# Patient Record
Sex: Female | Born: 1990
Health system: Southern US, Community
[De-identification: ages and names within clinical notes are randomized; demographics above are authoritative.]

## PROBLEM LIST (undated history)

## (undated) DIAGNOSIS — N390 Urinary tract infection, site not specified: Secondary | ICD-10-CM

## (undated) DIAGNOSIS — J40 Bronchitis, not specified as acute or chronic: Secondary | ICD-10-CM

## (undated) DIAGNOSIS — B019 Varicella without complication: Secondary | ICD-10-CM

## (undated) DIAGNOSIS — F32A Depression, unspecified: Secondary | ICD-10-CM

## (undated) DIAGNOSIS — F419 Anxiety disorder, unspecified: Secondary | ICD-10-CM

## (undated) DIAGNOSIS — F329 Major depressive disorder, single episode, unspecified: Secondary | ICD-10-CM

## (undated) DIAGNOSIS — E7800 Pure hypercholesterolemia, unspecified: Secondary | ICD-10-CM

## (undated) DIAGNOSIS — K219 Gastro-esophageal reflux disease without esophagitis: Secondary | ICD-10-CM

## (undated) HISTORY — DX: Depression, unspecified: F32.A

## (undated) HISTORY — DX: Varicella without complication: B01.9

## (undated) HISTORY — DX: Major depressive disorder, single episode, unspecified: F32.9

## (undated) HISTORY — DX: Pure hypercholesterolemia, unspecified: E78.00

## (undated) HISTORY — DX: Gastro-esophageal reflux disease without esophagitis: K21.9

## (undated) HISTORY — DX: Urinary tract infection, site not specified: N39.0

## (undated) HISTORY — PX: TONSILLECTOMY: SUR1361

---

## 2017-09-09 ENCOUNTER — Other Ambulatory Visit: Payer: Self-pay

## 2017-09-09 ENCOUNTER — Encounter (HOSPITAL_COMMUNITY): Payer: Self-pay | Admitting: Emergency Medicine

## 2017-09-09 ENCOUNTER — Ambulatory Visit (HOSPITAL_COMMUNITY)
Admission: EM | Admit: 2017-09-09 | Discharge: 2017-09-09 | Disposition: A | Discharge status: Home / Self Care | Payer: BLUE CROSS/BLUE SHIELD | Attending: Internal Medicine | Admitting: Internal Medicine

## 2017-09-09 DIAGNOSIS — F419 Anxiety disorder, unspecified: Secondary | ICD-10-CM | POA: Diagnosis not present

## 2017-09-09 HISTORY — DX: Bronchitis, not specified as acute or chronic: J40

## 2017-09-09 HISTORY — DX: Anxiety disorder, unspecified: F41.9

## 2017-09-09 MED ORDER — ALPRAZOLAM 0.25 MG PO TABS: 0.2500 mg | ORAL_TABLET | Freq: Two times a day (BID) | ORAL | 0 refills | Status: AC | PRN

## 2017-09-09 NOTE — ED Triage Notes (Addendum)
Patient concerned for a panic attack.  Reports over the past 2 days she has had episodes of feeling sob and panicky and feeling like she cannot swallow, and arm numbness, intermittent.  Left upper back with intermittent pain.  Patient has had intermittent chest tightness. Patient is also concerned for dizziness   Cousins husband had a stroke 2 weeks ago from a blood clot and was perceived to be young and healthy and then something so serious happen to them.  Anxiety has been worse for this patient

## 2017-09-09 NOTE — Discharge Instructions (Signed)
Please take Xanax as needed for anxiety. No more than every 12 hours. This will cause drowsiness and sedation, do not drive. If this sedates you too much, only take 1/2 pill.   Please try and get set up with a primary care to further discuss anxiety.

## 2017-09-10 NOTE — ED Provider Notes (Signed)
[facility]    CSN: [id number] Arrival date & time: 09/09/17  1950     History   Chief Complaint Chief Complaint  Patient presents with  . Anxiety    HPI Shacola Schussler Trellis Moment is a 27 y.o. female no significant past medical history presenting today with concern for panic attacks.  She states that over the past 2 days she has had a couple episodes where she feels very short of breath, arm numbness, dizziness, chest tightness, back pain, lightheadedness.  She states these episodes last approximately 20 minutes.  She states that she does suffer from anxiety, but has never been officially diagnosed or taken any medicine for this.  She is concerned today because a relative's husband had a stroke from a blood clot although young and healthy.  She denies any increase in stress recently or anything specific that she worries over.  She states these episodes will hit her randomly.  She denies history of hypertension, diabetes, smoking, family history of death from MI at early age.  Patient is on oral contraceptive pills, and recently traveled to [place] via airplane, denies unilateral lower leg swelling or lower extremity signs of DVT, cough, chest pain, personal history of cancer, previous clot, recent surgery.  She is concerned because she has felt like her left arm has been weak a lot lately.  She denies any acute vision changes.  Denies headache.  Patient does not have PCP hearing [place], originally from [place], working on getting established care in [place].  HPI  Past Medical History:  Diagnosis Date  . Anxiety   . Bronchitis     There are no active problems to display for this patient.   Past Surgical History:  Procedure Laterality Date  . TONSILLECTOMY      OB History    No data available       Home Medications    Prior to Admission medications   Medication Sig Start Date End Date Taking? Authorizing Provider  Amphetamine-Dextroamphetamine (ADDERALL PO)  Take by mouth.   Yes [provider]  NON FORMULARY    Yes [provider]  ALPRAZolam (XANAX) 0.25 MG tablet Take 1 tablet (0.25 mg total) by mouth 2 (two) times daily as needed for anxiety. 09/09/17   Teague Goynes, Junius Creamer, PA-C    Family History Family History  Problem Relation Age of Onset  . Diabetes Father     Social History Social History   Tobacco Use  . Smoking status: Never Smoker  Substance Use Topics  . Alcohol use: Yes  . Drug use: No     Allergies   Patient has no known allergies.   Review of Systems Review of Systems  Constitutional: Negative for chills, fatigue and fever.  HENT: Positive for trouble swallowing. Negative for congestion, ear pain, rhinorrhea and sore throat.   Respiratory: Positive for chest tightness and shortness of breath. Negative for cough.   Cardiovascular: Negative for chest pain, palpitations and leg swelling.  Gastrointestinal: Positive for nausea. Negative for abdominal pain and vomiting.  Musculoskeletal: Positive for back pain. Negative for myalgias.  Skin: Negative for rash.  Neurological: Positive for dizziness, weakness, light-headedness and numbness. Negative for syncope, speech difficulty and headaches.     Physical Exam Triage Vital Signs ED Triage Vitals  Enc Vitals Group     BP 09/09/17 2053 (!) 119/58     Pulse Rate 09/09/17 2053 64     Resp 09/09/17 2053 (!) 22  Temp 09/09/17 2053 98.7 F (37.1 C)     Temp Source 09/09/17 2053 Oral     SpO2 09/09/17 2053 100 %     Weight --      Height --      Head Circumference --      Peak Flow --      Pain Score 09/09/17 2051 4     Pain Loc --      Pain Edu? --      Excl. in GC? --    No data found.  Updated Vital Signs BP (!) 119/58 (BP Location: Left Arm) Comment (BP Location): large cuff  Pulse 64   Temp 98.7 F (37.1 C) (Oral)   Resp (!) 22   LMP 08/26/2017   SpO2 100%   Visual Acuity Right Eye Distance:   Left Eye Distance:   Bilateral  Distance:    Right Eye Near:   Left Eye Near:    Bilateral Near:     Physical Exam  Constitutional: She is oriented to person, place, and time. She appears well-developed and well-nourished. No distress.  HENT:  Head: Normocephalic and atraumatic.  Mouth/Throat: Oropharynx is clear and moist.  Eyes: Conjunctivae and EOM are normal. Pupils are equal, round, and reactive to light.  Neck: Neck supple.  Cardiovascular: Normal rate and regular rhythm.  No murmur heard. Pulmonary/Chest: Effort normal and breath sounds normal. No respiratory distress.  Breathing comfortably at rest during time of visit, clear to auscultation bilaterally; chest nontender to palpation  Abdominal: Soft. There is no tenderness.  Nontender to light and deep palpation  Musculoskeletal: She exhibits no edema.  No signs of DVT, bilateral lower extremities without calf tenderness, erythema, swelling.  Neurological: She is alert and oriented to person, place, and time.  Cranial nerves II through XII grossly intact, biceps tendon reflex 2+ bilaterally, patellar reflexes not obtainable bilaterally, patient states this is normal, coordination normal-finger to nose, rapid alternating movements, heel to shin normal.  Strength 5/5 equal in bilateral shoulders, hips, knees in all directions.  Sensation intact distally at upper extremities.  Skin: Skin is warm and dry.  Psychiatric: She has a normal mood and affect.  Nursing note and vitals reviewed.    UC Treatments / Results  Labs (all labs ordered are listed, but only abnormal results are displayed) Labs Reviewed - No data to display  EKG  EKG Interpretation None       Radiology No results found.  Procedures Procedures (including critical care time)  Medications Ordered in UC Medications - No data to display   Initial Impression / Assessment and Plan / UC Course  I have reviewed the triage vital signs and the nursing notes.  Pertinent labs & imaging  results that were available during my care of the patient were reviewed by me and considered in my medical decision making (see chart for details).     Patient with intermittent symptoms of chest discomfort, left arm numbness, shortness of breath.  EKG today normal sinus, no signs of ischemia.  Neuro exam with no focal neuro deficits.  Patient has negative risk factors for coronary artery disease, less concerning for acute ischemic event.  While patient does have positive risk factors of OCPs, recent airplane travel for PE, feel this diagnosis is less likely.  Patient had similar symptoms prior to beginning oral contraceptives 2 weeks ago as well as flying approximately 2 weeks ago as well.  Patient also without tachycardia, oxygen 98%.  Advised patient  to continue to monitor symptoms, and if symptoms persist or progress she may warrant further evaluation.  Discussed warning signs for this.  Advised patient to establish care with PCP to further discuss anxiety and to discuss need/want for any daily medicines.  In the meantime advised patient to take deep breaths during these episodes, will provide a few Xanax to take.  Patient plans to take half of a 0.25 to start.  She understands this is only as needed for these attacks.  Advised that will cause drowsiness, do not drive after using.  Panic attacks becoming more frequent or symptoms worsening, return here or go to emergency room.Discussed strict return precautions. Patient verbalized understanding and is agreeable with plan.   Final Clinical Impressions(s) / UC Diagnoses   Final diagnoses:  Anxiety    ED Discharge Orders        Ordered    ALPRAZolam (XANAX) 0.25 MG tablet  2 times daily PRN     09/09/17 2148       Controlled Substance Prescriptions [place] Controlled Substance Registry consulted? No   Lew Dawes, [place] 09/10/17 0041

## 2017-09-27 ENCOUNTER — Ambulatory Visit: Payer: BLUE CROSS/BLUE SHIELD | Admitting: Family Medicine

## 2017-09-27 ENCOUNTER — Encounter: Payer: Self-pay | Admitting: Family Medicine

## 2017-09-27 VITALS — BP 110/78 | HR 69 | Temp 98.0°F | Ht 61.0 in | Wt 241.0 lb

## 2017-09-27 DIAGNOSIS — Z13220 Encounter for screening for lipoid disorders: Secondary | ICD-10-CM

## 2017-09-27 DIAGNOSIS — J40 Bronchitis, not specified as acute or chronic: Secondary | ICD-10-CM

## 2017-09-27 DIAGNOSIS — F419 Anxiety disorder, unspecified: Secondary | ICD-10-CM

## 2017-09-27 DIAGNOSIS — Z131 Encounter for screening for diabetes mellitus: Secondary | ICD-10-CM | POA: Diagnosis not present

## 2017-09-27 DIAGNOSIS — F900 Attention-deficit hyperactivity disorder, predominantly inattentive type: Secondary | ICD-10-CM | POA: Diagnosis not present

## 2017-09-27 DIAGNOSIS — Z0000 Encounter for general adult medical examination without abnormal findings: Secondary | ICD-10-CM | POA: Diagnosis not present

## 2017-09-27 LAB — BASIC METABOLIC PANEL
BUN: 11 mg/dL (ref 6–23)
CALCIUM: 9.3 mg/dL (ref 8.4–10.5)
CHLORIDE: 103 meq/L (ref 96–112)
CO2: 26 meq/L (ref 19–32)
CREATININE: 0.72 mg/dL (ref 0.40–1.20)
GFR: 104.51 mL/min (ref >60.00)
Glucose, Bld: 84 mg/dL (ref 70–99)
Potassium: 4.6 mEq/L (ref 3.5–5.1)
Sodium: 137 mEq/L (ref 135–145)

## 2017-09-27 LAB — LIPID PANEL
CHOLESTEROL: 176 mg/dL (ref 0–200)
HDL: 48.5 mg/dL (ref >39.00)
LDL CALC: 102 mg/dL — AB (ref 0–99)
NonHDL: 127.07
TRIGLYCERIDES: 125 mg/dL (ref 0.0–149.0)
Total CHOL/HDL Ratio: 4
VLDL: 25 mg/dL (ref 0.0–40.0)

## 2017-09-27 LAB — CBC
HEMATOCRIT: 37.4 % (ref 36.0–46.0)
Hemoglobin: 12.1 g/dL (ref 12.0–15.0)
MCHC: 32.4 g/dL (ref 30.0–36.0)
MCV: 81.1 fl (ref 78.0–100.0)
Platelets: 245 10*3/uL (ref 150.0–400.0)
RBC: 4.62 Mil/uL (ref 3.87–5.11)
RDW: 15.2 % (ref 11.5–15.5)
WBC: 7.2 10*3/uL (ref 4.0–10.5)

## 2017-09-27 LAB — HEMOGLOBIN A1C: HEMOGLOBIN A1C: 5.5 % (ref 4.6–6.5)

## 2017-09-27 MED ORDER — PREDNISONE 10 MG PO TABS: ORAL_TABLET | ORAL | 0 refills | Status: DC

## 2017-09-27 NOTE — Patient Instructions (Addendum)
Preventive Care 18-39 Years, Female Preventive care refers to lifestyle choices and visits with your health care provider that can promote health and wellness. What does preventive care include?  A yearly physical exam. This is also called an annual well check.  Dental exams once or twice a year.  Routine eye exams. Ask your health care provider how often you should have your eyes checked.  Personal lifestyle choices, including: ? Daily care of your teeth and gums. ? Regular physical activity. ? Eating a healthy diet. ? Avoiding tobacco and drug use. ? Limiting alcohol use. ? Practicing safe sex. ? Taking vitamin and mineral supplements as recommended by your health care provider. What happens during an annual well check? The services and screenings done by your health care provider during your annual well check will depend on your age, overall health, lifestyle risk factors, and family history of disease. Counseling Your health care provider may ask you questions about your:  Alcohol use.  Tobacco use.  Drug use.  Emotional well-being.  Home and relationship well-being.  Sexual activity.  Eating habits.  Work and work Statistician.  Method of birth control.  Menstrual cycle.  Pregnancy history.  Screening You may have the following tests or measurements:  Height, weight, and BMI.  Diabetes screening. This is done by checking your blood sugar (glucose) after you have not eaten for a while (fasting).  Blood pressure.  Lipid and cholesterol levels. These may be checked every 5 years starting at age 66.  Skin check.  Hepatitis C blood test.  Hepatitis B blood test.  Sexually transmitted disease (STD) testing.  BRCA-related cancer screening. This may be done if you have a family history of breast, ovarian, tubal, or peritoneal cancers.  Pelvic exam and Pap test. This may be done every 3 years starting at age 40. Starting at age 59, this may be done every 5  years if you have a Pap test in combination with an HPV test.  Discuss your test results, treatment options, and if necessary, the need for more tests with your health care provider. Vaccines Your health care provider may recommend certain vaccines, such as:  Influenza vaccine. This is recommended every year.  Tetanus, diphtheria, and acellular pertussis (Tdap, Td) vaccine. You may need a Td booster every 10 years.  Varicella vaccine. You may need this if you have not been vaccinated.  HPV vaccine. If you are 69 or younger, you may need three doses over 6 months.  Measles, mumps, and rubella (MMR) vaccine. You may need at least one dose of MMR. You may also need a second dose.  Pneumococcal 13-valent conjugate (PCV13) vaccine. You may need this if you have certain conditions and were not previously vaccinated.  Pneumococcal polysaccharide (PPSV23) vaccine. You may need one or two doses if you smoke cigarettes or if you have certain conditions.  Meningococcal vaccine. One dose is recommended if you are age 27-21 years and a first-year college student living in a residence hall, or if you have one of several medical conditions. You may also need additional booster doses.  Hepatitis A vaccine. You may need this if you have certain conditions or if you travel or work in places where you may be exposed to hepatitis A.  Hepatitis B vaccine. You may need this if you have certain conditions or if you travel or work in places where you may be exposed to hepatitis B.  Haemophilus influenzae type b (Hib) vaccine. You may need this if  you have certain risk factors.  Talk to your health care provider about which screenings and vaccines you need and how often you need them. This information is not intended to replace advice given to you by your health care provider. Make sure you discuss any questions you have with your health care provider. Document Released: 09/08/2001 Document Revised: 04/01/2016  Document Reviewed: 05/14/2015 Elsevier Interactive Patient Education  2018 [place] After being diagnosed with an anxiety disorder, you may be relieved to know why you have felt or behaved a certain way. It is natural to also feel overwhelmed about the treatment ahead and what it will mean for your life. With care and support, you can manage this condition and recover from it. How to cope with anxiety Dealing with stress Stress is your body's reaction to life changes and events, both good and bad. Stress can last just a few hours or it can be ongoing. Stress can play a major role in anxiety, so it is important to learn both how to cope with stress and how to think about it differently. Talk with your health care provider or a counselor to learn more about stress reduction. He or she may suggest some stress reduction techniques, such as:  Music therapy. This can include creating or listening to music that you enjoy and that inspires you.  Mindfulness-based meditation. This involves being aware of your normal breaths, rather than trying to control your breathing. It can be done while sitting or walking.  Centering prayer. This is a kind of meditation that involves focusing on a word, phrase, or sacred image that is meaningful to you and that brings you peace.  Deep breathing. To do this, expand your stomach and inhale slowly through your nose. Hold your breath for 3-5 seconds. Then exhale slowly, allowing your stomach muscles to relax.  Self-talk. This is a skill where you identify thought patterns that lead to anxiety reactions and correct those thoughts.  Muscle relaxation. This involves tensing muscles then relaxing them.  Choose a stress reduction technique that fits your lifestyle and personality. Stress reduction techniques take time and practice. Set aside 5-15 minutes a day to do them. Therapists can offer training in these techniques. The training may be  covered by some insurance plans. Other things you can do to manage stress include:  Keeping a stress diary. This can help you learn what triggers your stress and ways to control your response.  Thinking about how you respond to certain situations. You may not be able to control everything, but you can control your reaction.  Making time for activities that help you relax, and not feeling guilty about spending your time in this way.  Therapy combined with coping and stress-reduction skills provides the best chance for successful treatment. Medicines Medicines can help ease symptoms. Medicines for anxiety include:  Anti-anxiety drugs.  Antidepressants.  Beta-blockers.  Medicines may be used as the main treatment for anxiety disorder, along with therapy, or if other treatments are not working. Medicines should be prescribed by a health care provider. Relationships Relationships can play a big part in helping you recover. Try to spend more time connecting with trusted friends and family members. Consider going to couples counseling, taking family education classes, or going to family therapy. Therapy can help you and others better understand the condition. How to recognize changes in your condition Everyone has a different response to treatment for anxiety. Recovery from anxiety happens when symptoms decrease  and stop interfering with your daily activities at home or work. This may mean that you will start to:  Have better concentration and focus.  Sleep better.  Be less irritable.  Have more energy.  Have improved memory.  It is important to recognize when your condition is getting worse. Contact your health care provider if your symptoms interfere with home or work and you do not feel like your condition is improving. Where to find help and support: You can get help and support from these sources:  Self-help groups.  Online and [employer].  A trusted spiritual  leader.  Couples counseling.  Family education classes.  Family therapy.  Follow these instructions at home:  Eat a healthy diet that includes plenty of vegetables, fruits, whole grains, low-fat dairy products, and lean protein. Do not eat a lot of foods that are high in solid fats, added sugars, or salt.  Exercise. Most adults should do the following: ? Exercise for at least 150 minutes each week. The exercise should increase your heart rate and make you sweat (moderate-intensity exercise). ? Strengthening exercises at least twice a week.  Cut down on caffeine, tobacco, alcohol, and other potentially harmful substances.  Get the right amount and quality of sleep. Most adults need 7-9 hours of sleep each night.  Make choices that simplify your life.  Take over-the-counter and prescription medicines only as told by your health care provider.  Avoid caffeine, alcohol, and certain over-the-counter cold medicines. These may make you feel worse. Ask your pharmacist which medicines to avoid.  Keep all follow-up visits as told by your health care provider. This is important. Questions to ask your health care provider  Would I benefit from therapy?  How often should I follow up with a health care provider?  How long do I need to take medicine?  Are there any long-term side effects of my medicine?  Are there any alternatives to taking medicine? Contact a health care provider if:  You have a hard time staying focused or finishing daily tasks.  You spend many hours a day feeling worried about everyday life.  You become exhausted by worry.  You start to have headaches, feel tense, or have nausea.  You urinate more than normal.  You have diarrhea. Get help right away if:  You have a racing heart and shortness of breath.  You have thoughts of hurting yourself or others. If you ever feel like you may hurt yourself or others, or have thoughts about taking your own life, get  help right away. You can go to your nearest emergency department or call:  Your local emergency services (911 in the [place]  A suicide crisis helpline, such as the [place] at (774) 874-8775. This is open 24-hours a day.  Summary  Taking steps to deal with stress can help calm you.  Medicines cannot cure anxiety disorders, but they can help ease symptoms.  Family, friends, and partners can play a big part in helping you recover from an anxiety disorder. This information is not intended to replace advice given to you by your health care provider. Make sure you discuss any questions you have with your health care provider. Document Released: 07/07/2016 Document Revised: 07/07/2016 Document Reviewed: 07/07/2016 Elsevier Interactive Patient Education  2018 [place] With Attention Deficit Hyperactivity Disorder If you have been diagnosed with attention deficit hyperactivity disorder (ADHD), you may be relieved that you now know why you have felt or behaved a  certain way. Still, you may feel overwhelmed about the treatment ahead. You may also wonder how to get the support you need and how to deal with the condition day-to-day. With treatment and support, you can live with ADHD and manage your symptoms. How to manage lifestyle changes Managing stress Stress is your body's reaction to life changes and events, both good and bad. To cope with the stress of an ADHD diagnosis, it may help to:  Learn more about ADHD.  Exercise regularly. Even a short daily walk can lower stress levels.  Participate in training or education programs (including social skills training classes) that teach you to deal with symptoms.  Medicines Your health care provider may suggest certain medicines if he or she feels that they will help to improve your condition. Stimulant medicines are usually prescribed to treat ADHD, and therapy may also be prescribed. It is important  to:  Avoid using alcohol and other substances that may prevent your medicines from working properly [facility]).  Talk with your pharmacist or health care provider about all the medicines that you take, their possible side effects, and what medicines are safe to take together.  Make it your goal to take part in all treatment decisions (shared decision-making). Ask about possible side effects of medicines that your health care provider recommends, and tell him or her how you feel about having those side effects. It is best if shared decision-making with your health care provider is part of your total treatment plan.  Relationships To strengthen your relationships with family members while treating your condition, consider taking part in family therapy. You might also attend self-help groups alone or with a loved one. Be honest about how your symptoms affect your relationships. Make an effort to communicate respectfully instead of fighting, and find ways to show others that you care. Psychotherapy may be useful in helping you cope with how ADHD affects your relationships. How to recognize changes in your condition The following signs may mean that your treatment is working well and your condition is improving:  Consistently being on time for appointments.  Being more organized at home and work.  Other people noticing improvements in your behavior.  Achieving goals that you set for yourself.  Thinking more clearly.  The following signs may mean that your treatment is not working very well:  Feeling impatience or more confusion.  Missing, forgetting, or being late for appointments.  An increasing sense of disorganization and messiness.  More difficulty in reaching goals that you set for yourself.  Loved ones becoming angry or frustrated with you.  Where to find support Talking to others  Keep emotion out of important discussions and speak in a calm, logical way.  Listen  closely and patiently to your loved ones. Try to understand their point of view, and try to avoid getting defensive.  Take responsibility for the consequences of your actions.  Ask that others do not take your behaviors personally.  Aim to solve problems as they come up, and express your feelings instead of bottling them up.  Talk openly about what you need from your loved ones and how they can support you.  Consider going to family therapy sessions or having your family meet with a specialist who deals with ADHD-related behavior problems. Finances Not all insurance plans cover mental health care, so it is important to check with your insurance carrier. If paying for co-pays or counseling services is a problem, search for a local or county mental health  care center. Public mental health care services may be offered there at a low cost or no cost when you are not able to see a private health care provider. If you are taking medicine for ADHD, you may be able to get the generic form, which may be less expensive than brand-name medicine. Some makers of prescription medicines also offer help to patients who cannot afford the medicines that they need. Follow these instructions at home:  Take over-the-counter and prescription medicines only as told by your health care provider. Check with your health care provider before taking any new medicines.  Create structure and an organized atmosphere at home. For example: ? Make a list of tasks, then rank them from most important to least important. Work on one task at a time until your listed tasks are done. ? Make a daily schedule and follow it consistently every day. ? Use an appointment calendar, and check it 2 or 3 times a day to keep on track. Keep it with you when you leave the house. ? Create spaces where you keep certain things, and always put things back in their places after you use them.  Keep all follow-up visits as told by your health care  provider. This is important. Questions to ask your health care provider:  What are the risks and benefits of taking medicines?  Would I benefit from therapy?  How often should I follow up with a health care provider? Contact a health care provider if:  You have side effects from your medicines, such as: ? Repeated muscle twitches, coughing, or speech outbursts. ? Sleep problems. ? Loss of appetite. ? Depression. ? New or worsening behavior problems. ? Dizziness. ? Unusually fast heartbeat. ? Stomach pains. ? Headaches. Get help right away if:  You have a severe reaction to a medicine.  Your behavior suddenly gets worse. Summary  With treatment and support, you can live with ADHD and manage your symptoms.  The medicines that are most often prescribed for ADHD are stimulants.  Consider taking part in family therapy or self-help groups with family members or friends.  When you talk with friends and family about your ADHD, be patient and communicate openly.  Take over-the-counter and prescription medicines only as told by your health care provider. Check with your health care provider before taking any new medicines. This information is not intended to replace advice given to you by your health care provider. Make sure you discuss any questions you have with your health care provider. Document Released: 11/12/2016 Document Revised: 11/12/2016 Document Reviewed: 11/12/2016 Elsevier Interactive Patient Education  [employer].

## 2017-09-27 NOTE — Progress Notes (Signed)
Patient presents to clinic today for CPE and to establish care.  SUBJECTIVE: PMH:  Pt is a 27 yo with pmh sig for ADHD, Anxiety.  She was previously seen in [place].  Pt is also followed by OB/GYN.  Pt is on OCPs.  H/o Bronchitis: -Patient endorses recent episode of bronchitis d/x'd in late December/January. -Patient endorses feeling better but the cough is still lingering. -Patient denies fever, chills, headache, shortness of breath  ADHD: -d/x'd 2 years ago -Patient is followed by a psychiatrist -She is currently on Adderall 20 mg twice daily -Patient denies issues with sleep, weight loss, history of blood pressure issues  Anxiety/depression: -Patient is followed by psychiatry -Patient endorses recent panic attack for which she went to [facility] -At that time she was given Rx for Xanax 0.25 mg -pt states a family member who was in good health recently had a stroke in his early 35s.  The thought of this made her anxious. -Patient states her mood, energy, sleep are good.  Allergies: NKDA  Past surgical history: Tonsillectomy  Social history: Patient is single.  She denies alcohol, tobacco, drug use.  LMP 08/20/17  Family medical history: Mom-alive Dad-alive, diabetes Brother-Robert, alive  Health Maintenance: PAP -- 08/23/17   Past Medical History:  Diagnosis Date  . Anxiety   . Bronchitis   . Chicken pox   . Depression   . GERD (gastroesophageal reflux disease)   . High cholesterol   . UTI (urinary tract infection)     Past Surgical History:  Procedure Laterality Date  . TONSILLECTOMY      Current Outpatient Medications on File Prior to Visit  Medication Sig Dispense Refill  . ALPRAZolam (XANAX) 0.25 MG tablet Take 1 tablet (0.25 mg total) by mouth 2 (two) times daily as needed for anxiety. 10 tablet 0  . Amphetamine-Dextroamphetamine (ADDERALL PO) Take by mouth.    Marland Kitchen BLISOVI 24 FE 1-20 MG-MCG(24) tablet Take 1 tablet by mouth daily.  2  . NON FORMULARY        No current facility-administered medications on file prior to visit.     No Known Allergies  Family History  Problem Relation Age of Onset  . Diabetes Father     Social History   Socioeconomic History  . Marital status: Single    Spouse name: Not on file  . Number of children: Not on file  . Years of education: Not on file  . Highest education level: Not on file  Social Needs  . Financial resource strain: Not on file  . Food insecurity - worry: Not on file  . Food insecurity - inability: Not on file  . Transportation needs - medical: Not on file  . Transportation needs - non-medical: Not on file  Occupational History  . Not on file  Tobacco Use  . Smoking status: Never Smoker  . Smokeless tobacco: Never Used  Substance and Sexual Activity  . Alcohol use: Yes    Comment: ocassionally   . Drug use: No  . Sexual activity: Yes  Other Topics Concern  . Not on file  Social History Narrative  . Not on file    ROS General: Denies fever, chills, night sweats, changes in weight, changes in appetite HEENT: Denies headaches, ear pain, changes in vision, rhinorrhea, sore throat CV: Denies CP, palpitations, SOB, orthopnea Pulm: Denies SOB, cough, wheezing GI: Denies abdominal pain, nausea, vomiting, diarrhea, constipation GU: Denies dysuria, hematuria, frequency, vaginal discharge Msk: Denies muscle cramps, joint pains  Neuro: Denies weakness, numbness, tingling Skin: Denies rashes, bruising Psych: Denies hallucinations  +depression, anxiety  BP 110/78   Pulse 69   Temp 98 F (36.7 C) (Oral)   Ht 5\' 1"  (1.549 m)   Wt 241 lb (109.3 kg)   LMP 09/20/2017 (Approximate)   SpO2 100%   BMI 45.54 kg/m   Physical Exam Gen. Pleasant, well developed, well-nourished, in NAD HEENT - [place]/AT, PERRL, no scleral icterus, no nasal drainage, pharynx without erythema or exudate. Neck: No JVD, no thyromegaly Lungs: occasional cough, no use of accessory muscles, faint scattered  wheezes b/l. Cardiovascular: RRR, No r/g/m, no peripheral edema Abdomen: BS present, soft, nontender,nondistended, no hepatosplenomegaly Musculoskeletal: No deformities, moves all four extremities, no cyanosis or clubbing, normal tone Neuro:  A&Ox3, CN II-XII intact, normal gait Skin:  Warm, dry, intact, no lesions Psych: normal affect, mood appropriate  No results found for this or any previous visit (from the past 2160 hour(s)).  Assessment/Plan: Well adult exam -Anticipatory guidance given including wearing seatbelts, smoke detectors in the home, increasing physical activity, increasing p.o. intake of water, increasing p.o. intake of vegetables. -Patient given handout -Pap up-to-date as followed by OB/GYN -We will obtain labs this visit -Next CPE in 1 year - Plan: Basic metabolic panel, CBC (no diff)  Attention deficit hyperactivity disorder (ADHD), predominantly inattentive type -Continue Adderall 20 mg twice daily -Continue following with psychiatry  Anxiety -Stable -Patient encouraged to continue following with psychiatry -Patient given handout -Patient has Rx for Xanax 0.25 mg, prescribed by UC  Screening for cholesterol level - Plan: Lipid panel  Screening for diabetes mellitus - Plan: Hemoglobin A1c  Bronchitis - Plan: predniSONE (DELTASONE) 10 MG tablet  Follow-up PRN.  Next CPE in 1 year  Abbe AmsterdamShannon Felicha Frayne, MD

## 2018-03-24 ENCOUNTER — Ambulatory Visit: Payer: BLUE CROSS/BLUE SHIELD | Admitting: Family Medicine

## 2018-03-24 ENCOUNTER — Encounter: Payer: Self-pay | Admitting: Family Medicine

## 2018-03-24 ENCOUNTER — Ambulatory Visit (INDEPENDENT_AMBULATORY_CARE_PROVIDER_SITE_OTHER): Payer: BLUE CROSS/BLUE SHIELD

## 2018-03-24 VITALS — BP 106/68 | HR 86 | Temp 97.5°F | Wt 247.0 lb

## 2018-03-24 DIAGNOSIS — R05 Cough: Secondary | ICD-10-CM | POA: Diagnosis not present

## 2018-03-24 DIAGNOSIS — J302 Other seasonal allergic rhinitis: Secondary | ICD-10-CM | POA: Diagnosis not present

## 2018-03-24 DIAGNOSIS — R059 Cough, unspecified: Secondary | ICD-10-CM

## 2018-03-24 MED ORDER — LORATADINE 10 MG PO TABS: 10.0000 mg | ORAL_TABLET | Freq: Every day | ORAL | 11 refills | Status: AC

## 2018-03-24 NOTE — Progress Notes (Signed)
Subjective:    Patient ID: Bailey Oneill, female    DOB: 01/30/1991, 27 y.o.   MRN: [id number]  No chief comDonato Schultzplaint on file.   HPI Patient was seen today for ongoing concern.  Pt notes continued cough, wheezing, and occasional nasal drainage since Dec 2018/Jan 2019.  Pt has been dx'd with bronchitis numerous times.  [facility]  Pt denies h/o allergies, but does note as a child she had to take an allergy med.  Pt also mentions having "cold symptoms" if she is around her family dog for too long.  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  [place].  Past Medical History:  Diagnosis Date  . Anxiety   . Bronchitis   . Chicken pox   . Depression   . GERD (gastroesophageal reflux disease)   . High cholesterol   . UTI (urinary tract infection)     No Known Allergies  ROS General: Denies fever, chills, night sweats, changes in weight, changes in appetite HEENT: Denies headaches, ear pain, changes in vision, rhinorrhea, sore throat  +occasional nasal drainage CV: Denies CP, palpitations, SOB, orthopnea Pulm: Denies SOB    +chronic cough, wheezing GI: Denies abdominal pain, nausea, vomiting, diarrhea, constipation GU: Denies dysuria, hematuria, frequency, vaginal discharge Msk: Denies muscle cramps, joint pains Neuro: Denies weakness, numbness, tingling Skin: Denies rashes, bruising Psych: Denies depression, anxiety, hallucinations     Objective:    Blood pressure 106/68, pulse 86, temperature (!) 97.5 F (36.4 C), temperature source Oral, weight 247 lb (112 kg), SpO2 98 %.   Gen. Pleasant, well-nourished, in no distress, normal affect   HEENT: English/AT, face symmetric, no scleral icterus, PERRLA, EOMI, nares patent without drainage, Mallampati 3 difficult to assess pharynx.  TMs full bilaterally.  No cervical lymphadenopathy. Lungs: cough, no accessory muscle use, CTAB, no wheezes or  rales Cardiovascular: RRR, no m/r/g, no peripheral edema Neuro:  A&Ox3, CN II-XII intact, normal gait Skin:  Warm, no lesions/ rash   Wt Readings from Last 3 Encounters:  03/24/18 247 lb (112 kg)  09/27/17 241 lb (109.3 kg)    Lab Results  Component Value Date   WBC 7.2 09/27/2017   HGB 12.1 09/27/2017   HCT 37.4 09/27/2017   PLT 245.0 09/27/2017   GLUCOSE 84 09/27/2017   CHOL 176 09/27/2017   TRIG 125.0 09/27/2017   HDL 48.50 09/27/2017   LDLCALC 102 (H) 09/27/2017   NA 137 09/27/2017   K 4.6 09/27/2017   CL 103 09/27/2017   CREATININE 0.72 09/27/2017   BUN 11 09/27/2017   CO2 26 09/27/2017   HGBA1C 5.5 09/27/2017    Assessment/Plan:  Cough  -discussed possible causes including allergies, infection, etc. -will obtain CXR given duration of symptoms -given handout - Plan: DG Chest 2 View, loratadine (CLARITIN) 10 MG tablet  Seasonal allergies  - Plan: loratadine (CLARITIN) 10 MG tablet  F/u prn  Abbe AmsterdamShannon Banks, MD

## 2018-03-24 NOTE — Patient Instructions (Signed)
Allergies, Adult An allergy is when your body's defense system (immune system) overreacts to an otherwise harmless substance (allergen) that you breathe in or eat or something that touches your skin. When you come into contact with something that you are allergic to, your immune system produces certain proteins (antibodies). These proteins cause cells to release chemicals (histamines) that trigger the symptoms of an allergic reaction. Allergies often affect the nasal passages (allergic rhinitis), eyes (allergic conjunctivitis), skin (atopic dermatitis), and stomach. Allergies can be mild or severe. Allergies cannot spread from person to person (are not contagious). They can develop at any age and may be outgrown. What increases the risk? You may be at greater risk of allergies if other people in your family have allergies. What are the signs or symptoms? Symptoms depend on what type of allergy you have. They may include:  Runny, stuffy nose.  Sneezing.  Itchy mouth, ears, or throat.  Postnasal drip.  Sore throat.  Itchy, red, watery, or puffy eyes.  Skin rash or hives.  Stomach pain.  Vomiting.  Diarrhea.  Bloating.  Wheezing or coughing.  People with a severe allergy to food, medicine, or an insect bite may have a life-threatening allergic reaction (anaphylaxis). Symptoms of anaphylaxis include:  Hives.  Itching.  Flushed face.  Swollen lips, tongue, or mouth.  Tight or swollen throat.  Chest pain or tightness in the chest.  Trouble breathing or shortness of breath.  Rapid heartbeat.  Dizziness or fainting.  Vomiting.  Diarrhea.  Pain in the abdomen.  How is this diagnosed? This condition is diagnosed based on:  Your symptoms.  Your family and medical history.  A physical exam.  You may need to see a health care provider who specializes in treating allergies (allergist). You may also have tests, including:  Skin tests to see which allergens are  causing your symptoms, such as: ? Skin prick test. In this test, your skin is pricked with a tiny needle and exposed to small amounts of possible allergens to see if your skin reacts. ? Intradermal skin test. In this test, a small amount of allergen is injected under your skin to see if your skin reacts. ? Patch test. In this test, a small amount of allergen is placed on your skin and then your skin is covered with a bandage. Your health care provider will check your skin after a couple of days to see if a rash has developed.  Blood tests.  Challenges tests. In this test, you inhale a small amount of allergen by mouth to see if you have an allergic reaction.  You may also be asked to:  Keep a food diary. A food diary is a record of all the foods and drinks you have in a day and any symptoms you experience.  Practice an elimination diet. An elimination diet involves eliminating specific foods from your diet and then adding them back in one by one to find out if a certain food causes an allergic reaction.  How is this treated? Treatment for allergies depends on your symptoms. Treatment may include:  Cold compresses to soothe itching and swelling.  Eye drops.  Nasal sprays.  Using a saline spray or container (neti pot) to flush out the nose (nasal irrigation). These methods can help clear away mucus and keep the nasal passages moist.  Using a humidifier.  Oral antihistamines or other medicines to block allergic reaction and inflammation.  Skin creams to treat rashes or itching.  Diet changes to   eliminate food allergy triggers.  Repeated exposure to tiny amounts of allergens to build up a tolerance and prevent future allergic reactions (immunotherapy). These include: ? Allergy shots. ? Oral treatment. This involves taking small doses of an allergen under the tongue (sublingual immunotherapy).  Emergency epinephrine injection (auto-injector) in case of an allergic emergency. This is  a self-injectable, pre-measured medicine that must be given within the first few minutes of a serious allergic reaction.  Follow these instructions at home:  Avoid known allergens whenever possible.  If you suffer from airborne allergens, wash out your nose daily. You can do this with a saline spray or a neti pot to flush out your nose (nasal irrigation).  Take over-the-counter and prescription medicines only as told by your health care provider.  Keep all follow-up visits as told by your health care provider. This is important.  If you are at risk of a severe allergic reaction (anaphylaxis), keep your auto-injector with you at all times.  If you have ever had anaphylaxis, wear a medical alert bracelet or necklace that states you have a severe allergy. Contact a health care provider if:  Your symptoms do not improve with treatment. Get help right away if:  You have symptoms of anaphylaxis, such as: ? Swollen mouth, tongue, or throat. ? Pain or tightness in your chest. ? Trouble breathing or shortness of breath. ? Dizziness or fainting. ? Severe abdominal pain, vomiting, or diarrhea. This information is not intended to replace advice given to you by your health care provider. Make sure you discuss any questions you have with your health care provider. Document Released: 10/06/2002 Document Revised: 11/11/2016 Document Reviewed: 01/29/2016 Elsevier Interactive Patient Education  2018 [employer]  Cough, Adult Coughing is a reflex that clears your throat and your airways. Coughing helps to heal and protect your lungs. It is normal to cough occasionally, but a cough that happens with other symptoms or lasts a long time may be a sign of a condition that needs treatment. A cough may last only 2-3 weeks (acute), or it may last longer than 8 weeks (chronic). What are the causes? Coughing is commonly caused by:  Breathing in substances that irritate your lungs.  A viral or bacterial  respiratory infection.  Allergies.  Asthma.  Postnasal drip.  Smoking.  Acid backing up from the stomach into the esophagus (gastroesophageal reflux).  Certain medicines.  Chronic lung problems, including COPD (or rarely, lung cancer).  Other medical conditions such as heart failure.  Follow these instructions at home: Pay attention to any changes in your symptoms. Take these actions to help with your discomfort:  Take medicines only as told by your health care provider. ? If you were prescribed an antibiotic medicine, take it as told by your health care provider. Do not stop taking the antibiotic even if you start to feel better. ? Talk with your health care provider before you take a cough suppressant medicine.  Drink enough fluid to keep your urine clear or pale yellow.  If the air is dry, use a cold steam vaporizer or humidifier in your bedroom or your home to help loosen secretions.  Avoid anything that causes you to cough at work or at home.  If your cough is worse at night, try sleeping in a semi-upright position.  Avoid cigarette smoke. If you smoke, quit smoking. If you need help quitting, ask your health care provider.  Avoid caffeine.  Avoid alcohol.  Rest as needed.  Contact a health  care provider if:  You have new symptoms.  You cough up pus.  Your cough does not get better after 2-3 weeks, or your cough gets worse.  You cannot control your cough with suppressant medicines and you are losing sleep.  You develop pain that is getting worse or pain that is not controlled with pain medicines.  You have a fever.  You have unexplained weight loss.  You have night sweats. Get help right away if:  You cough up blood.  You have difficulty breathing.  Your heartbeat is very fast. This information is not intended to replace advice given to you by your health care provider. Make sure you discuss any questions you have with your health care  provider. Document Released: 01/09/2011 Document Revised: 12/19/2015 Document Reviewed: 09/19/2014 Elsevier Interactive Patient Education  [employer].

## 2018-03-29 ENCOUNTER — Telehealth: Payer: Self-pay | Admitting: Family Medicine

## 2018-03-29 NOTE — Telephone Encounter (Signed)
Copied from CRM [id number]. Topic: Quick Communication - Lab Results >> Mar 29, 2018  9:02 AM Carola Rhine, CMA wrote: Called patient to inform them of  lab results. When patient returns call, triage nurse may disclose results.   Patient was already given the results but has additional questions for the nurse

## 2018-03-29 NOTE — Telephone Encounter (Signed)
Pt calling after receiving results from chest xray on 9/39/19. Pt states she is aware that she has chronic bronchitis and looked up causes online in which she noted that it was most commonly diagnosed in smokers. Pt states she is not a smoker and wanted to know what else could cause bronchitis. Explained to pt that due to the length of time she experienced symptoms that she was diagnosed with chronic bronchitis and also that it was not limited to being a smoker. Explained to pt that bronchitis could be caused by particulates getting into the lungs and causing irritation.Pt also asking if Delsym would help treat bronchitis. Explained to pt that Delsym was used to help with the cough that is associated with bronchitis. Pt advised that if cough did not improve with taking Delsym to return call to the office. Pt verbalized understanding.

## 2018-03-29 NOTE — Telephone Encounter (Signed)
Patient called, left VM to return call to the office for additional questions about imaging results given on 03/29/18-see result note.

## 2018-04-01 ENCOUNTER — Ambulatory Visit: Payer: Self-pay | Admitting: Family Medicine

## 2018-04-01 NOTE — Telephone Encounter (Signed)
   Reason for Disposition . Caller has medication question only, adult not sick, and triager answers question    Wanted to know if it was ok to take Benadryl  Answer Assessment - Initial Assessment Questions 1. SYMPTOMS: "Do you have any symptoms?"     She was wanting to know if it was ok to take Benadryl.   She has been diagnosed with bronchitis by a doctor. I reviewed her medications and noticed she has Claritin prescribed.   She denies taking it but asked if it was ok to take it instead of Benadryl.   I let her know it would be ok.    She can take Benadryl if she wants but don't take both of them    Either take the Claritin which is a 24 hour pill so don't cut it in half or crush it.   Take it whole.   That way she gets 24 hour relief.    If she decided to take the Benadryl don't take the Claritin.   She verbalized understanding and thanked me for my help. 2. SEVERITY: If symptoms are present, ask "Are they mild, moderate or severe?"     I returned her call and answered her question.  Protocols used: MEDICATION QUESTION CALL-A-AH

## 2019-03-09 IMAGING — DX DG CHEST 2V
2 series · 2 of 2 positions shown · non-contrast
Comparison: None.

CLINICAL DATA: Continued cough for several months

EXAM:
CHEST - 2 VIEW

[chest pa]
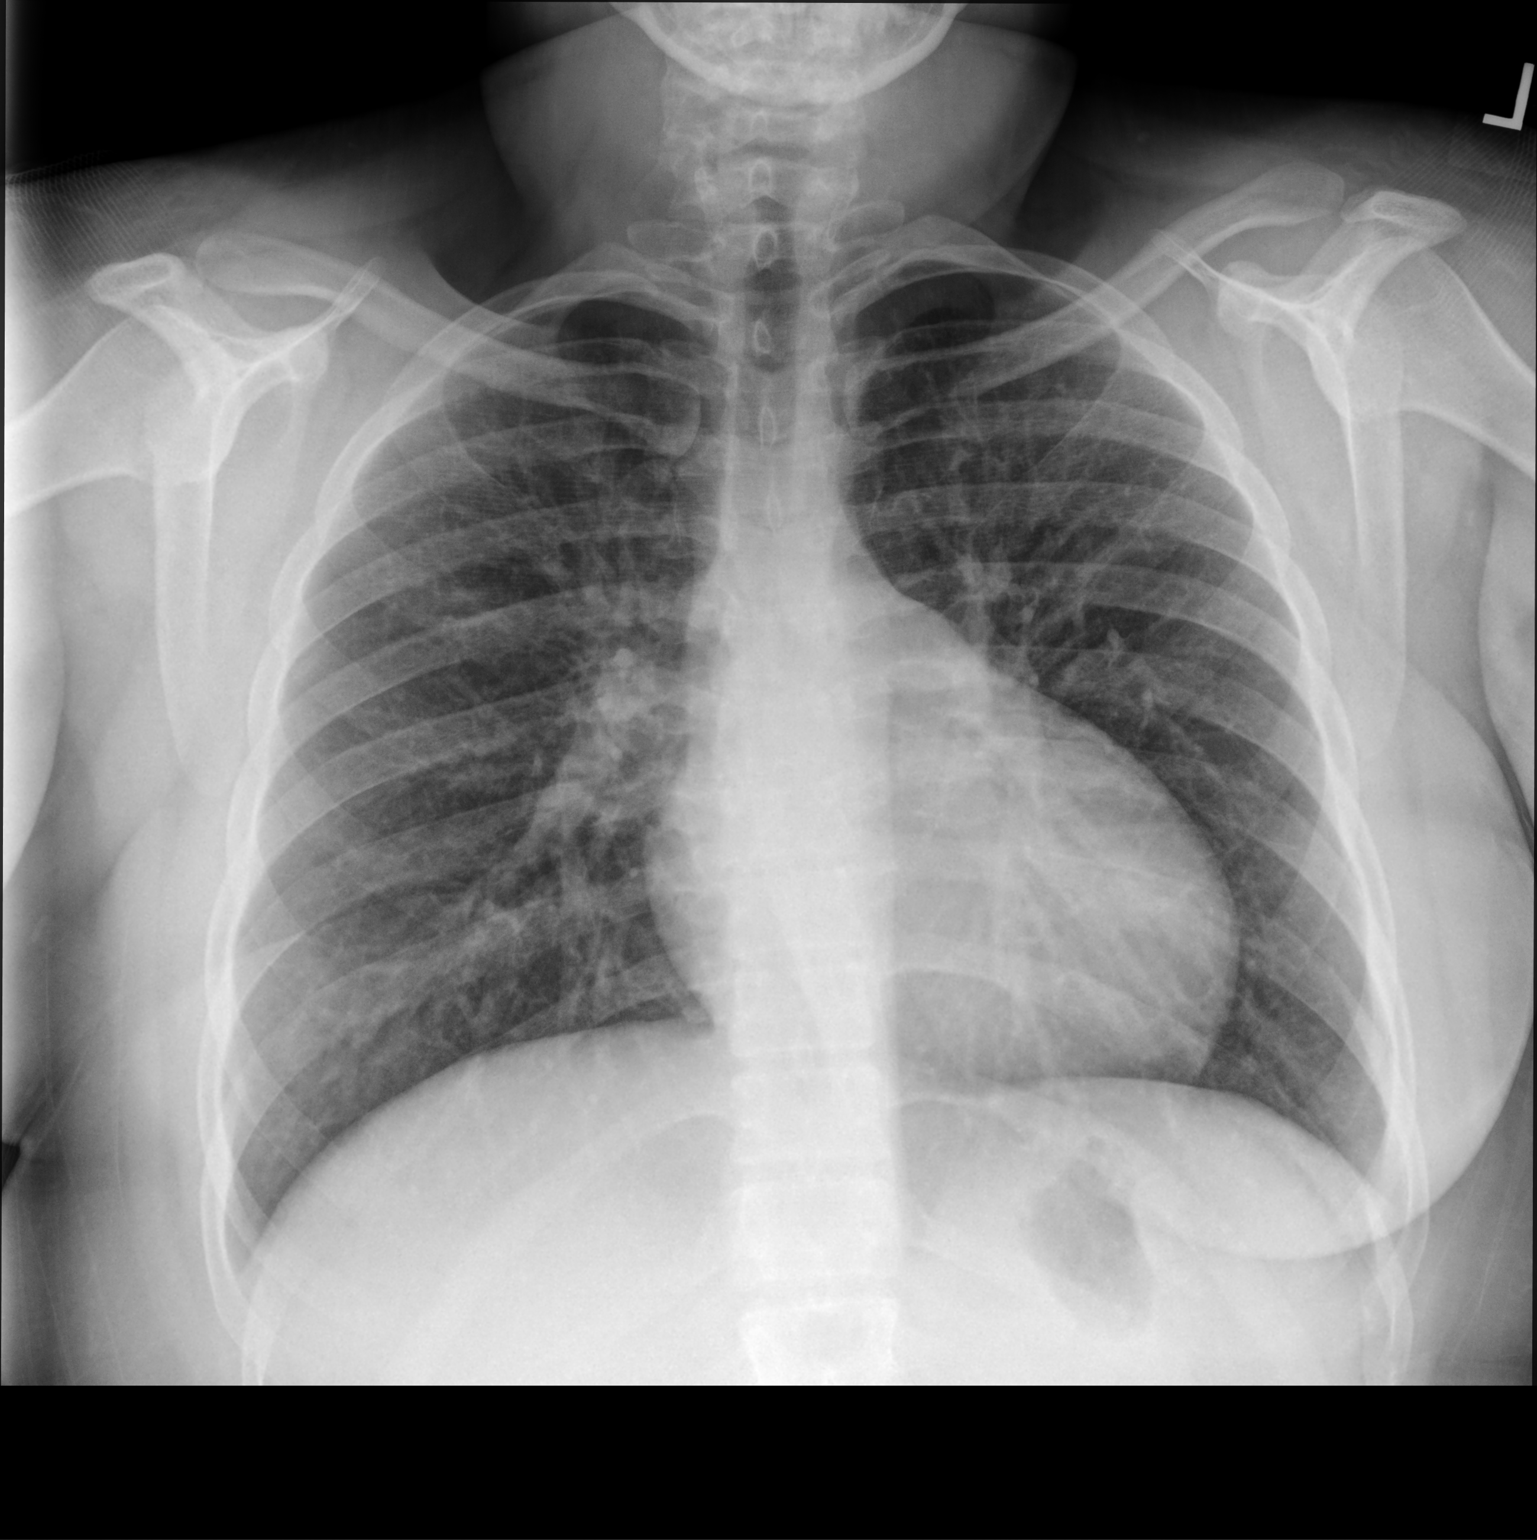

[chest lat]
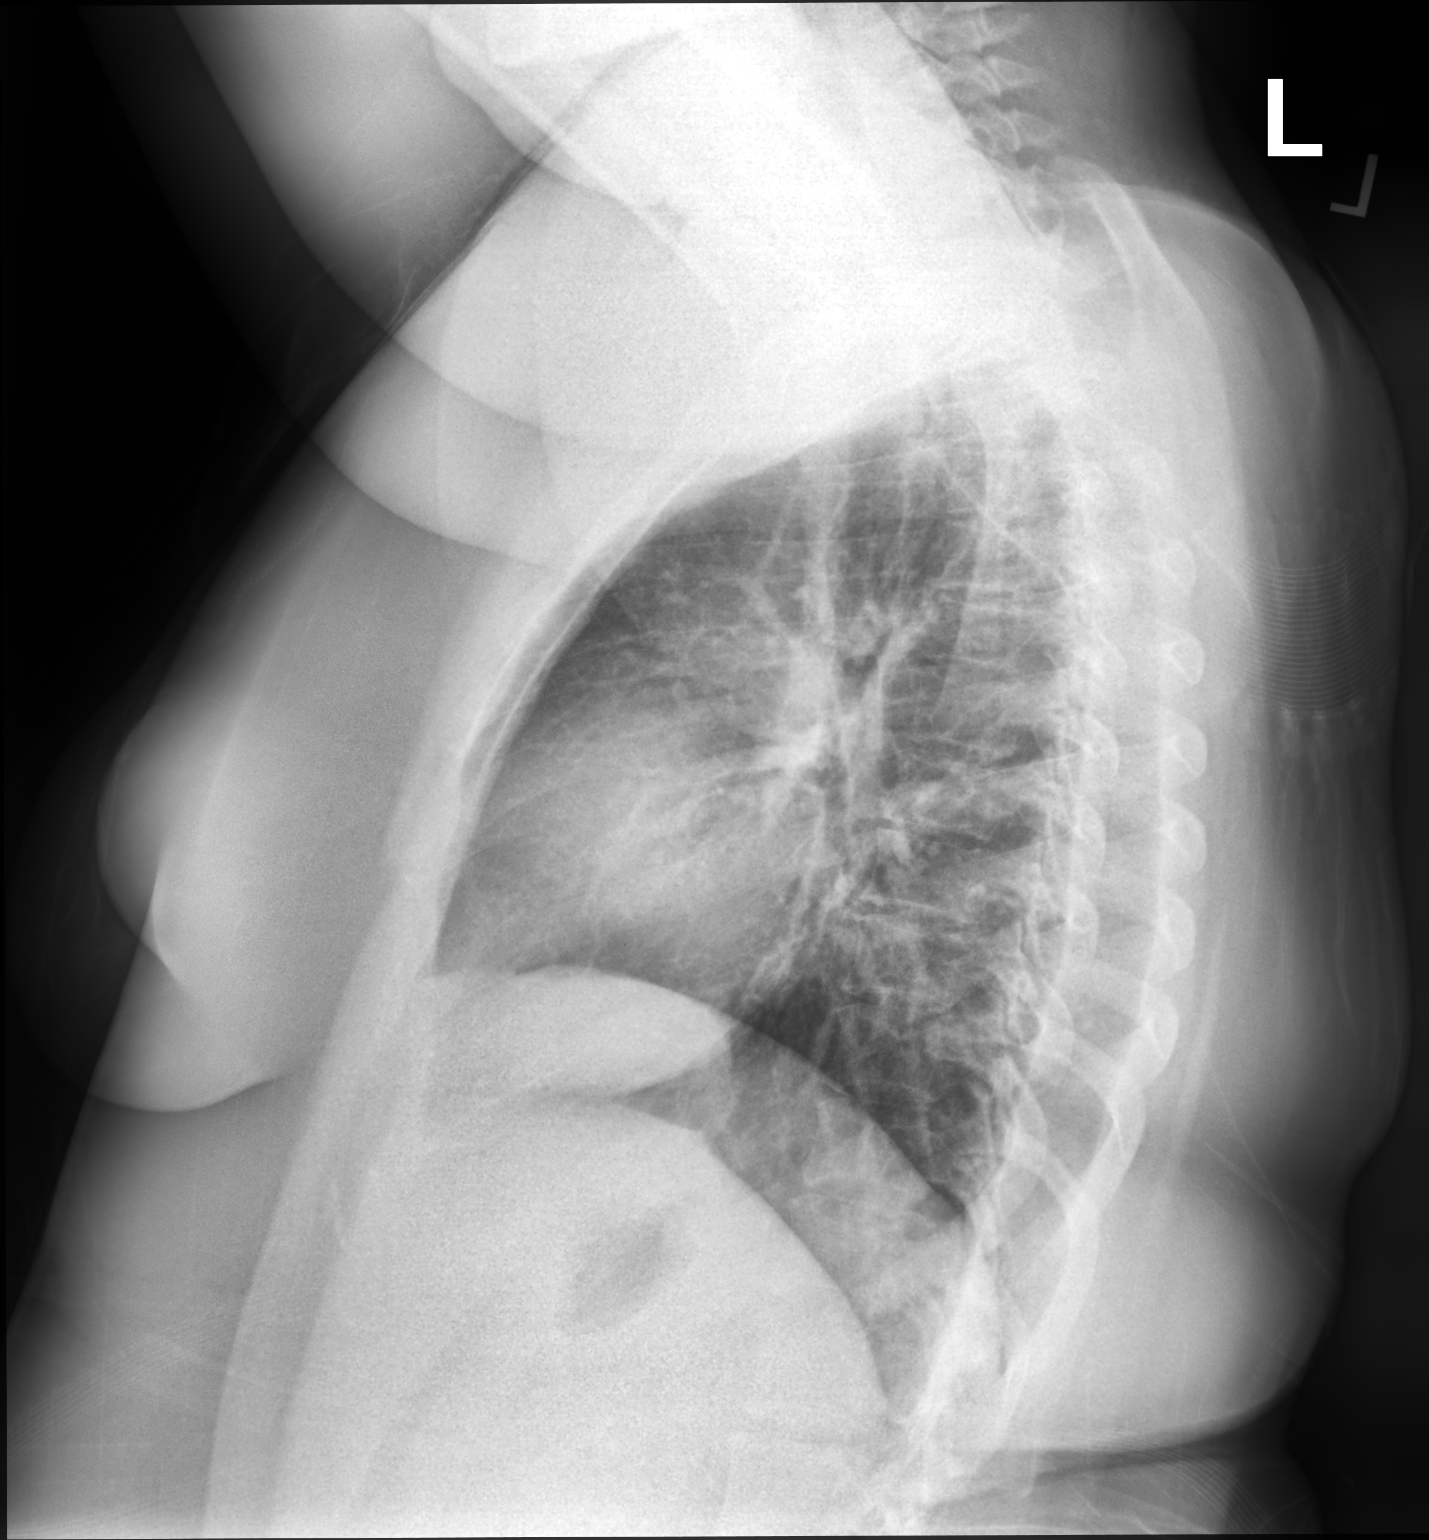

[2 of 2 positions shown; findings below may reference images not displayed]

FINDINGS: Cardiac shadow is within normal limits. Mild bronchitic changes are
noted bilaterally without focal confluent infiltrate. No sizable
effusion is seen. No bony abnormality is noted.
IMPRESSION: Changes consistent with bronchitis. This may be of a chronic nature.
No prior exam is available for comparison.
